# Patient Record
Sex: Female | Born: 1950 | Race: White | Hispanic: No | Marital: Single | Smoking: Never smoker
Health system: Southern US, Community
[De-identification: ages and names within clinical notes are randomized; demographics above are authoritative.]

## PROBLEM LIST (undated history)

## (undated) DIAGNOSIS — I10 Essential (primary) hypertension: Secondary | ICD-10-CM

---

## 2016-12-06 ENCOUNTER — Emergency Department (HOSPITAL_COMMUNITY): Payer: Medicare HMO

## 2016-12-06 ENCOUNTER — Other Ambulatory Visit: Payer: Self-pay

## 2016-12-06 ENCOUNTER — Emergency Department (HOSPITAL_COMMUNITY)
Admission: EM | Admit: 2016-12-06 | Discharge: 2016-12-06 | Disposition: A | Discharge status: Home / Self Care | Payer: Medicare HMO | Attending: Emergency Medicine | Admitting: Emergency Medicine

## 2016-12-06 ENCOUNTER — Encounter (HOSPITAL_COMMUNITY): Payer: Self-pay | Admitting: *Deleted

## 2016-12-06 DIAGNOSIS — I10 Essential (primary) hypertension: Secondary | ICD-10-CM | POA: Insufficient documentation

## 2016-12-06 DIAGNOSIS — X500XXA Overexertion from strenuous movement or load, initial encounter: Secondary | ICD-10-CM | POA: Insufficient documentation

## 2016-12-06 DIAGNOSIS — S83411A Sprain of medial collateral ligament of right knee, initial encounter: Secondary | ICD-10-CM | POA: Diagnosis not present

## 2016-12-06 DIAGNOSIS — Y999 Unspecified external cause status: Secondary | ICD-10-CM | POA: Diagnosis not present

## 2016-12-06 DIAGNOSIS — Y929 Unspecified place or not applicable: Secondary | ICD-10-CM | POA: Insufficient documentation

## 2016-12-06 DIAGNOSIS — S8991XA Unspecified injury of right lower leg, initial encounter: Secondary | ICD-10-CM | POA: Diagnosis present

## 2016-12-06 DIAGNOSIS — Y9389 Activity, other specified: Secondary | ICD-10-CM | POA: Insufficient documentation

## 2016-12-06 HISTORY — DX: Essential (primary) hypertension: I10

## 2016-12-06 MED ORDER — TRAMADOL HCL 50 MG PO TABS: 50.0000 mg | ORAL_TABLET | Freq: Four times a day (QID) | ORAL | 0 refills | Status: AC | PRN

## 2016-12-06 NOTE — Discharge Instructions (Signed)
Follow-up with an orthopedic doctor or your primary doctor if not improving in the next weekTake the medications as needed for pain, you can also take over-the-counter medications instead,

## 2016-12-06 NOTE — ED Provider Notes (Signed)
[facility] Provider Note   CSN: [id number] Arrival date & time: 12/06/16  1647     History   Chief Complaint Chief Complaint  Patient presents with  . Knee Pain    HPI [place]ard MethVicky Johnson is a 66 y.o. female.  HPI Patient presents to the emergency room for evaluation of right knee pain.  Patient states she was taking down some Christmas decorations.  She was bending over and turned.  Patient thinks she may have tweaked something in her knee.  She is having pain in the medial and posterior aspect of her right knee.  She denies any fevers.  No leg swelling.  No other complaints of chest pain, shortness of breath, abdominal pain or any other complaints.  She has tried over-the-counter medications but continues to have pain so she presented to the emergency room Past Medical History:  Diagnosis Date  . Hypertension     There are no active problems to display for this patient.   History reviewed. No pertinent surgical history.  OB History    No data available       Home Medications    Prior to Admission medications   Medication Sig Start Date End Date Taking? Authorizing Provider  traMADol (ULTRAM) 50 MG tablet Take 1 tablet (50 mg total) by mouth every 6 (six) hours as needed. 12/06/16   Linwood DibblesKnapp, Malayah Demuro, MD    Family History No family history on file.  Social History Social History   Tobacco Use  . Smoking status: Never Smoker  . Smokeless tobacco: Never Used  Substance Use Topics  . Alcohol use: No    Frequency: Never  . Drug use: No     Allergies   Penicillins and Zocor [simvastatin]   Review of Systems Review of Systems  All other systems reviewed and are negative.    Physical Exam Updated Vital Signs BP (!) 178/91   Pulse 66   Temp 98.9 F (37.2 C) (Oral)   Resp 18   Ht 1.651 m (5\' 5" )   Wt 86.2 kg (190 lb)   SpO2 100%   BMI 31.62 kg/m   Physical Exam  Constitutional: She appears well-developed and well-nourished. No  distress.  HENT:  Head: Normocephalic and atraumatic.  Right Ear: External ear normal.  Left Ear: External ear normal.  Eyes: Conjunctivae are normal. Right eye exhibits no discharge. Left eye exhibits no discharge. No scleral icterus.  Neck: Neck supple. No tracheal deviation present.  Cardiovascular: Normal rate.  Pulmonary/Chest: Effort normal. No stridor. No respiratory distress.  Abdominal: She exhibits no distension.  Musculoskeletal: She exhibits no edema.       Right hip: Normal.       Right knee: She exhibits no swelling, no effusion, no deformity and no erythema. Tenderness found. Medial joint line tenderness noted.       Right ankle: Normal.       Right upper leg: Normal.       Right lower leg: Normal.       Right foot: Normal.  Neurological: She is alert. Cranial nerve deficit: no gross deficits.  Skin: Skin is warm and dry. No rash noted.  Psychiatric: She has a normal mood and affect.  Nursing note and vitals reviewed.    ED Treatments / Results    Radiology Dg Knee Complete 4 Views Right  Result Date: 12/06/2016 CLINICAL DATA:  Medial knee pain with walking. Possible twisting injury yesterday. EXAM: RIGHT KNEE - COMPLETE 4+ VIEW COMPARISON:  None. FINDINGS: No acute displaced appearing fracture or joint effusion. No joint dislocation or intra-articular loose bodies. Slight femorotibial joint space narrowing with minimal spurring off the medial femoral condyle and tibial plateau. IMPRESSION: 1. Mild osteoarthritis of the right knee. 2. No joint effusion nor acute displaced appearing fracture. 3. If pain is out of proportion to radiographic findings, CT may help for further evaluation of radiographically occult fractures Electronically Signed   By: Tollie Ethavid  Kwon M.D.   On: 12/06/2016 17:36    Procedures Procedures (including critical care time)  Medications Ordered in ED Medications - No data to display   Initial Impression / Assessment and Plan / ED Course  I  have reviewed the triage vital signs and the nursing notes.  Pertinent labs & imaging results that were available during my care of the patient were reviewed by me and considered in my medical decision making (see chart for details).   Mild oa noted on xray.  Suspect sprain versus mild meniscal injury.  Knee sleeve, pain meds, follow up with pCP or ortho  Final Clinical Impressions(s) / ED Diagnoses   Final diagnoses:  Sprain of medial collateral ligament of right knee, initial encounter    ED Discharge Orders        Ordered    traMADol (ULTRAM) 50 MG tablet  Every 6 hours PRN     12/06/16 1747       Linwood DibblesKnapp, Tyler Cubit, MD 12/06/16 [phone]

## 2016-12-06 NOTE — ED Triage Notes (Signed)
Pt states that she felt a pain in her right knee after pulling some christmas boxes yesterday,

## 2019-06-17 IMAGING — DX DG KNEE COMPLETE 4+V*R*
4 series · 4 of 4 positions shown · non-contrast
Comparison: None.

CLINICAL DATA: Medial knee pain with walking. Possible twisting
injury yesterday.

EXAM:
RIGHT KNEE - COMPLETE 4+ VIEW

[knee ap (1 of 3)]
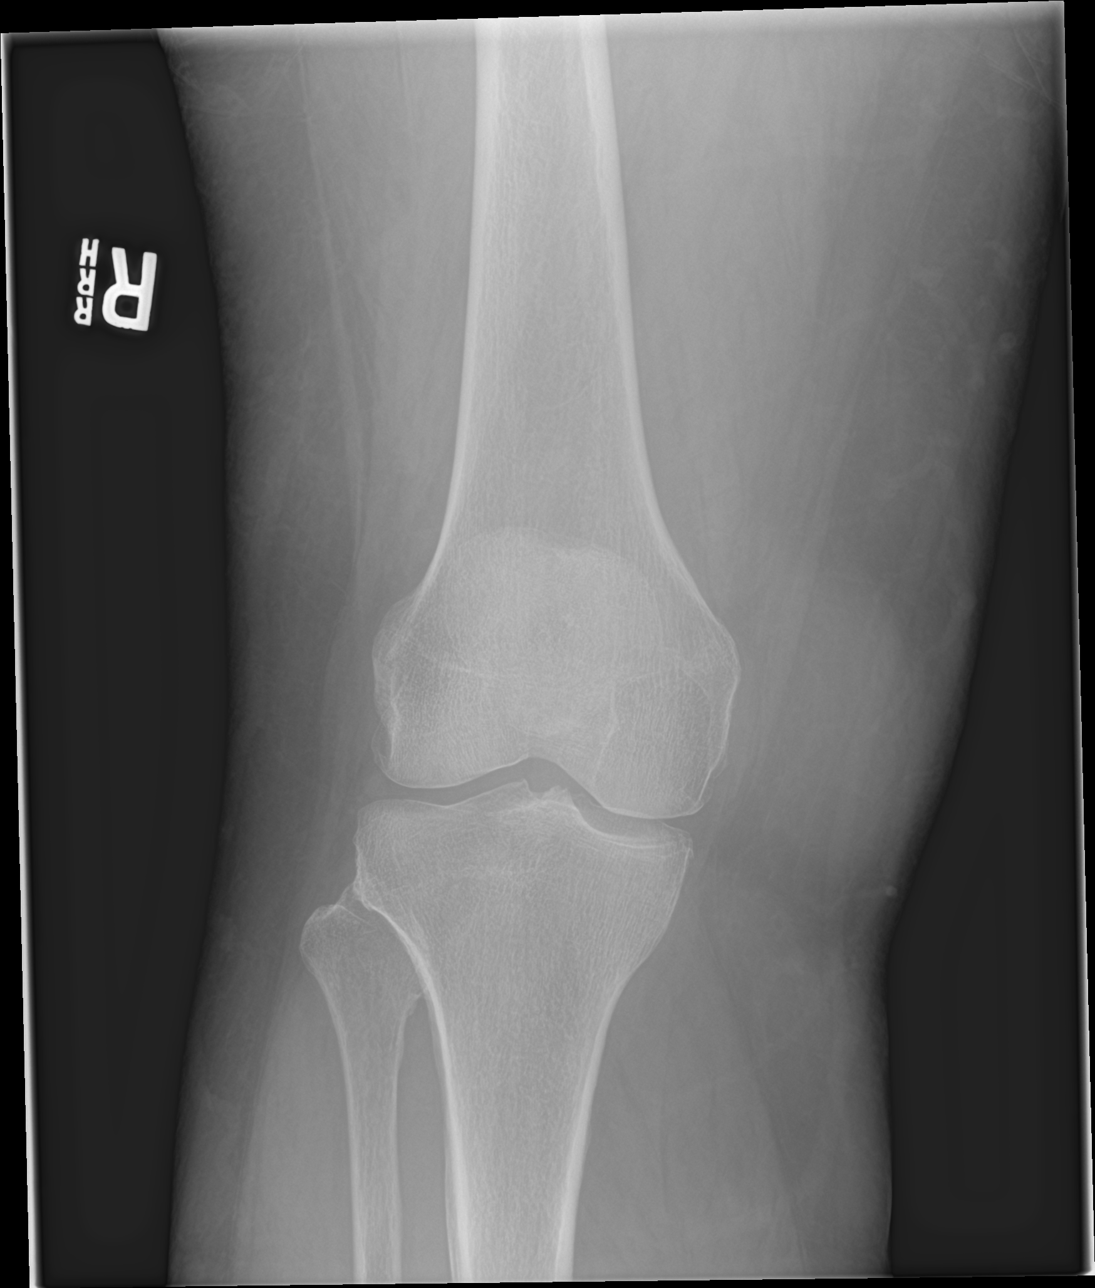

[knee ap (2 of 3)]
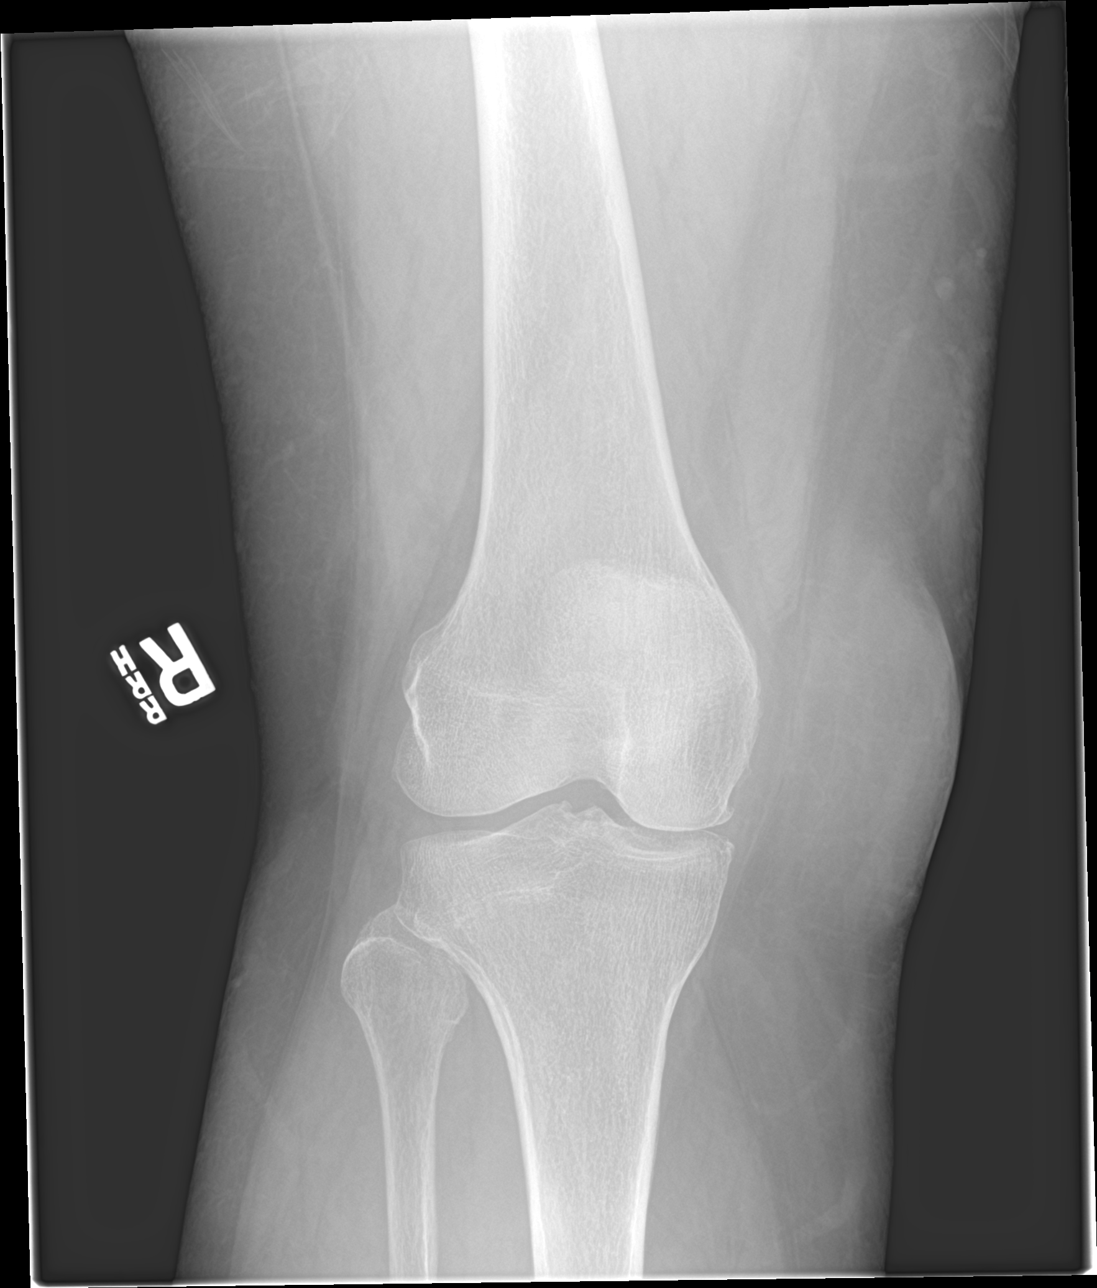

[knee ap (3 of 3)]
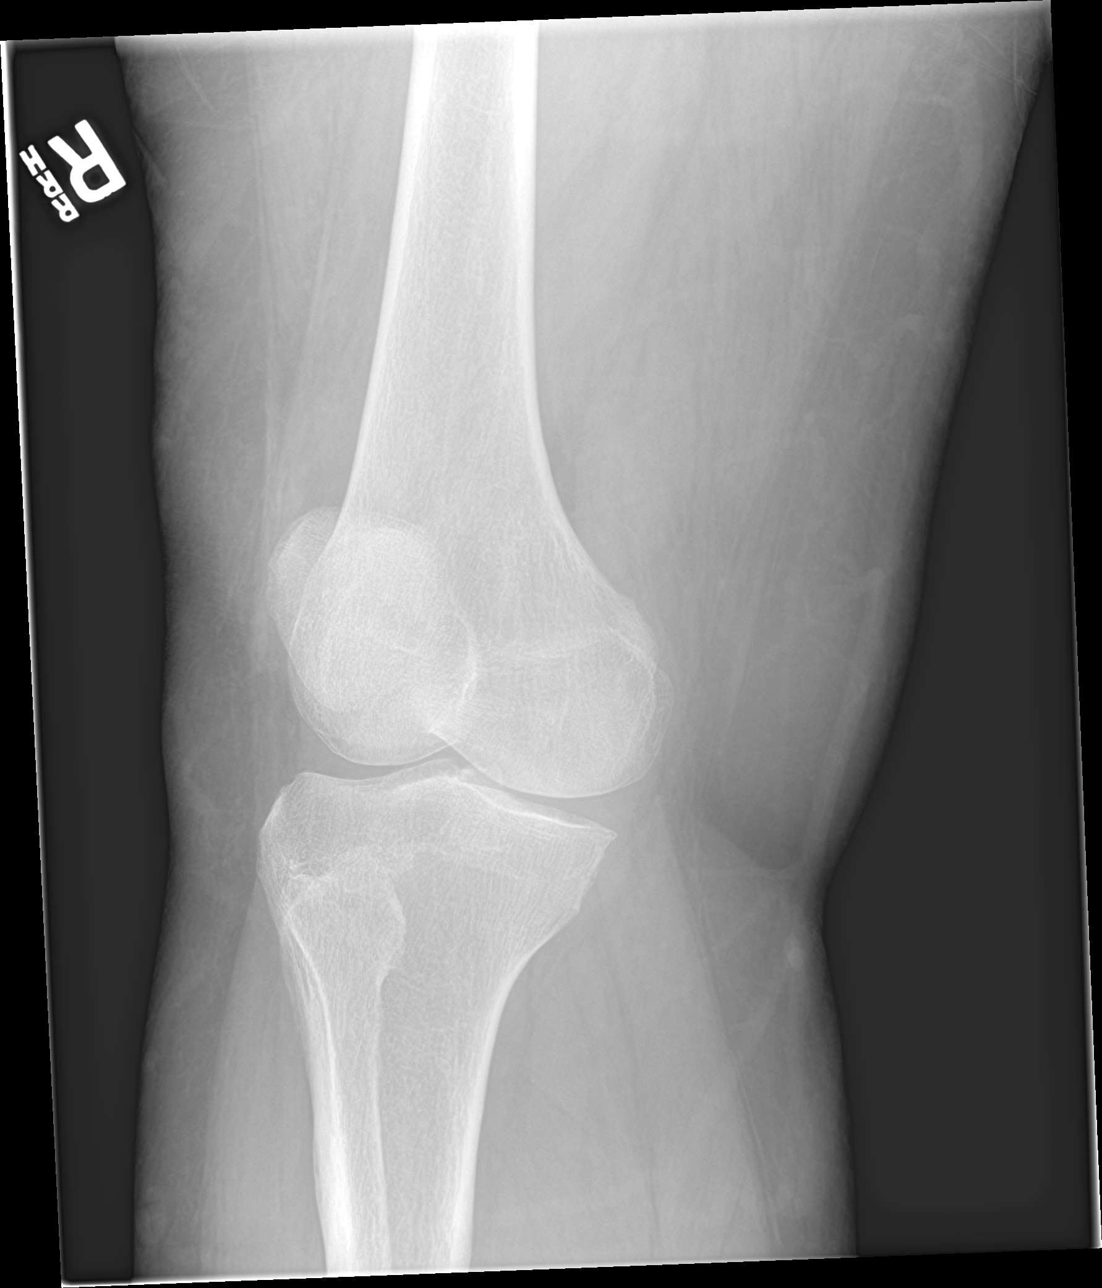

[knee lat]
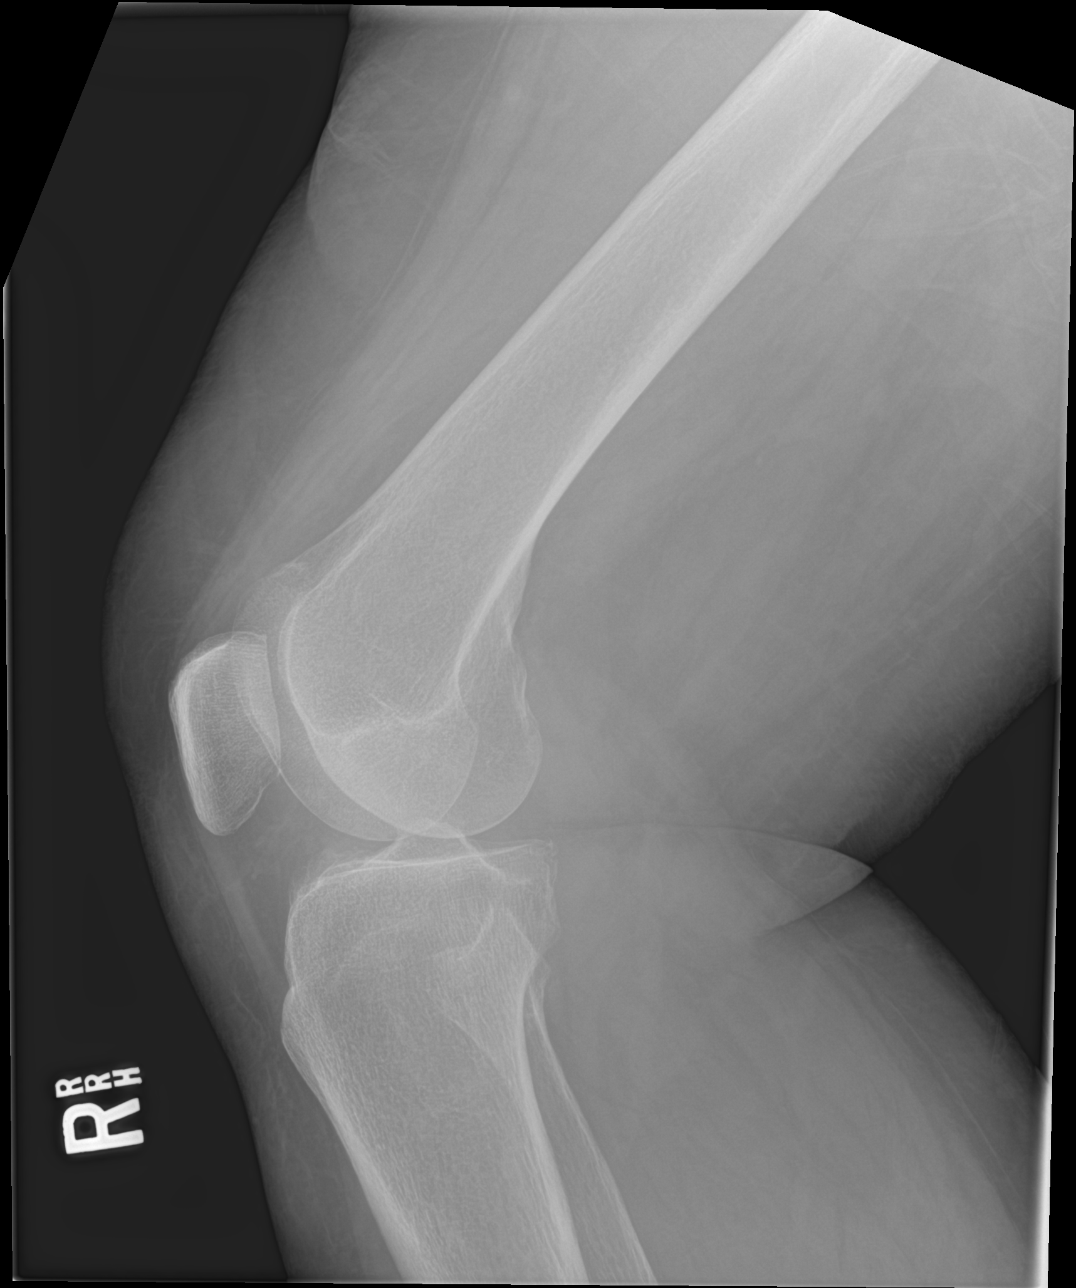

[4 of 4 positions shown; findings below may reference images not displayed]

FINDINGS: No acute displaced appearing fracture or joint effusion. No joint
dislocation or intra-articular loose bodies. Slight femorotibial
joint space narrowing with minimal spurring off the medial femoral
condyle and tibial plateau.
IMPRESSION: 1. Mild osteoarthritis of the right knee.
2. No joint effusion nor acute displaced appearing fracture.
3. If pain is out of proportion to radiographic findings, CT may
help for further evaluation of radiographically occult fractures
